# Patient Record
Sex: Male | Born: 1990 | Race: Black or African American | Hispanic: No | Marital: Single | Smoking: Current every day smoker
Health system: Southern US, Community
[De-identification: ages and names within clinical notes are randomized; demographics above are authoritative.]

---

## 2012-01-10 ENCOUNTER — Encounter (HOSPITAL_COMMUNITY): Payer: Self-pay | Admitting: Emergency Medicine

## 2012-01-10 ENCOUNTER — Emergency Department (HOSPITAL_COMMUNITY)
Admission: EM | Admit: 2012-01-10 | Discharge: 2012-01-11 | Disposition: A | Discharge status: Home / Self Care | Payer: Medicaid Other | Attending: Emergency Medicine | Admitting: Emergency Medicine

## 2012-01-10 DIAGNOSIS — F438 Other reactions to severe stress: Secondary | ICD-10-CM | POA: Insufficient documentation

## 2012-01-10 DIAGNOSIS — F17200 Nicotine dependence, unspecified, uncomplicated: Secondary | ICD-10-CM | POA: Insufficient documentation

## 2012-01-10 DIAGNOSIS — R443 Hallucinations, unspecified: Secondary | ICD-10-CM | POA: Insufficient documentation

## 2012-01-10 DIAGNOSIS — F430 Acute stress reaction: Secondary | ICD-10-CM

## 2012-01-10 DIAGNOSIS — F4389 Other reactions to severe stress: Secondary | ICD-10-CM | POA: Insufficient documentation

## 2012-01-10 NOTE — ED Provider Notes (Signed)
History     CSN: [id number]  Arrival date & time 01/10/12  2318   First MD Initiated Contact with Patient 01/10/12 2352      Chief Complaint  Patient presents with  . Suicidal    (Consider location/radiation/quality/duration/timing/severity/associated sxs/prior treatment) HPI Comments: Patient has been suicidal for 2 years per his own report he has attempted suicide in the past by trying to jump off a bridge.  Tonight, he again attempted to jump but  his brother stopped him.  He has not sought any outside help.  He refuses to talk about what is bothering him  The history is provided by the patient.    History reviewed. No pertinent past medical history.  History reviewed. No pertinent past surgical history.  No family history on file.  History  Substance Use Topics  . Smoking status: Current Everyday Smoker  . Smokeless tobacco: Not on file  . Alcohol Use: Yes      Review of Systems  Constitutional: Positive for activity change.  HENT: Negative for hearing loss.   Respiratory: Negative for shortness of breath.   Cardiovascular: Negative for chest pain.  Genitourinary: Negative for dysuria.  Neurological: Negative for dizziness and speech difficulty.  Psychiatric/Behavioral: Positive for suicidal ideas and hallucinations.    Allergies  Review of patient's allergies indicates no known allergies.  Home Medications  No current outpatient prescriptions on file.  BP 128/89  Pulse 93  Temp(Src) 98.1 F (36.7 C) (Oral)  Resp 16  SpO2 100%  Physical Exam  Constitutional: He is oriented to person, place, and time. He appears well-developed and well-nourished.  HENT:  Head: Normocephalic.  Eyes: Pupils are equal, round, and reactive to light.  Cardiovascular: Normal rate.   Pulmonary/Chest: Effort normal.  Musculoskeletal: Normal range of motion.  Neurological: He is alert and oriented to person, place, and time.  Skin: Skin is warm and dry.  Psychiatric: He is  actively hallucinating. He expresses no homicidal ideation.    ED Course  Procedures (including critical care time)  Labs Reviewed  URINE RAPID DRUG SCREEN (HOSP PERFORMED) - Abnormal; Notable for the following:    Tetrahydrocannabinol POSITIVE (*)    All other components within normal limits  ETHANOL  CBC  DIFFERENTIAL  BASIC METABOLIC PANEL   No results found.   1. Reaction, situational, acute, to stress       MDM  Will add ACT assess States patient with [facility]us like psychiatrist.  She feels that the patient was acting out, rather than truly being suicidal.  He does not display any depression or active suicidality.  She feels it is safe for him to be discharged home to his brother have discussed this with Dr. Earlie Lou.  I will refer him to our mental health services       Arman Filter, NP 01/11/12 0000  Arman Filter, NP 01/11/12 [phone]

## 2012-01-10 NOTE — ED Notes (Signed)
PT. REPORTS SUICIDAL IDEATION FOR SEVERAL DAYS WITH AUDITORY HALLUCINATIONS , PLANS TO JUMP OVER BRIDGE.  SECURITY WANDED PT. AT TRIAGE / PT. GIVEN PAPER SCRUBS TO WEAR.

## 2012-01-10 NOTE — ED Notes (Signed)
CHARGE NURSE NOTIFIED FOR PT.'S SITTER.

## 2012-01-11 LAB — RAPID URINE DRUG SCREEN, HOSP PERFORMED
Barbiturates: NOT DETECTED
Cocaine: NOT DETECTED
Tetrahydrocannabinol: POSITIVE — AB

## 2012-01-11 MED ORDER — IBUPROFEN 200 MG PO TABS: 600.0000 mg | ORAL_TABLET | Freq: Three times a day (TID) | ORAL | Status: DC | PRN

## 2012-01-11 MED ORDER — LORAZEPAM 1 MG PO TABS: 1.0000 mg | ORAL_TABLET | Freq: Three times a day (TID) | ORAL | Status: DC | PRN

## 2012-01-11 MED ORDER — NICOTINE 21 MG/24HR TD PT24: 21.0000 mg | MEDICATED_PATCH | Freq: Every day | TRANSDERMAL | Status: DC

## 2012-01-11 MED ORDER — ACETAMINOPHEN 325 MG PO TABS: 650.0000 mg | ORAL_TABLET | ORAL | Status: DC | PRN

## 2012-01-11 MED ORDER — ONDANSETRON HCL 8 MG PO TABS: 4.0000 mg | ORAL_TABLET | Freq: Three times a day (TID) | ORAL | Status: DC | PRN

## 2012-01-11 NOTE — ED Notes (Signed)
Pt resting. No needs at this time.

## 2012-01-11 NOTE — ED Notes (Signed)
Pt. In room condusting telepsych consult.

## 2012-01-11 NOTE — BH Assessment (Signed)
Assessment Note   Paul Hubbard is an 21 y.o. male brought to [facility] by his brother after being found attempting to jump off a bridge.  PT reports he's been having suicidal ideation for a few months now and has made a few other passive attempts.  Pt also thinks of killing himself with a gun.  Pt is flat and quiet.  Is not forthcoming about stressors, but reports being overwhelmed and feeling worthless.  Pt denies HI. Informatino faxed to [facility] for review for an inpatient bed.  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 No pertinent past medical history. Axis IV: other psychosocial or environmental problems Axis V: 21-30 behavior considerably influenced by delusions or hallucinations OR serious impairment in judgment, communication OR inability to function in almost all areas  Past Medical History: History reviewed. No pertinent past medical history.  History reviewed. No pertinent past surgical history.  Family History: No family history on file.  Social History:  reports that he has been smoking.  He does not have any smokeless tobacco history on file. He reports that he drinks alcohol. He reports that he does not use illicit drugs.  Additional Social History:  Alcohol / Drug Use History of alcohol / drug use?: No history of alcohol / drug abuse Substance #1 Name of Substance 1: Marijuna 1 - Age of First Use: 15 1 - Amount (size/oz): 1 joint 1 - Frequency: daily 1 - Duration: 5 years 1 - Last Use / Amount: 1 joint/01/10/12 Substance #2 Name of Substance 2: denies any other substance use 2 - Age of First Use: denies any other substance use 2 - Amount (size/oz): denies any other substance use 2 - Frequency: denies any other substance use 2 - Duration: denies any other substance use 2 - Last Use / Amount: denies any other substance use Allergies: No Known Allergies  Home Medications:  No current facility-administered medications  on file as of 01/10/2012.   No current outpatient prescriptions on file as of 01/10/2012.    OB/GYN Status:  No LMP for male patient.  General Assessment Data Location of Assessment: [facility] ED Living Arrangements: Relatives (Brother and mother) Can pt return to current living arrangement?: Yes Admission Status: Voluntary Is patient capable of signing voluntary admission?: Yes Transfer from: Acute Hospital Referral Source: Self/Family/Friend  Education Status Is patient currently in school?: Yes (Classes at [employer]) Highest grade of school patient has completed: 12  Risk to self Suicidal Ideation: Yes-Currently Present Suicidal Intent: No-Not Currently/Within Last 6 Months Is patient at risk for suicide?: Yes (jump off bridge, shoot self, multiple) Suicidal Plan?: Yes-Currently Present Specify Current Suicidal Plan: jump off bridge Access to Means: Yes Specify Access to Suicidal Means: environmental What has been your use of drugs/alcohol within the last 12 months?: marijuana Previous Attempts/Gestures: Yes How many times?: 2  Other Self Harm Risks: n/a Triggers for Past Attempts: Other personal contacts ("the past") Intentional Self Injurious Behavior: None Family Suicide History: Yes (uncle) Recent stressful life event(s): Other (Comment);Financial Problems;Job Loss (would not specify) Persecutory voices/beliefs?: No (pt denied to clinician, but admitted to NP) Depression: Yes Depression Symptoms: Despondent;Tearfulness;Fatigue;Guilt;Loss of interest in usual pleasures;Feeling worthless/self pity;Feeling angry/irritable;Isolating;Insomnia Substance abuse history and/or treatment for substance abuse?: Yes Suicide prevention information given to non-admitted patients: Not applicable  Risk to Others Homicidal Ideation: No Thoughts of Harm to Others: No Current Homicidal Intent: No Current Homicidal Plan: No Access to Homicidal Means: No History of harm  to others?: Yes Assessment  of Violence: In distant past Violent Behavior Description: calm and cooperative Does patient have access to weapons?: No Criminal Charges Pending?: No Does patient have a court date: No  Psychosis Hallucinations: None noted Delusions: None noted  Mental Status Report Appear/Hygiene: Other (Comment) (unremarkable) Eye Contact: Good Motor Activity: Unremarkable Speech: Logical/coherent;Soft Level of Consciousness: Alert Mood: Depressed;Sad;Worthless, low self-esteem Affect: Appropriate to circumstance;Sad;Sullen Anxiety Level: None Thought Processes: Coherent;Relevant Judgement: Unimpaired Orientation: Person;Place;Time;Situation Obsessive Compulsive Thoughts/Behaviors: None  Cognitive Functioning Concentration: Normal Memory: Recent Intact;Remote Intact IQ: Average Insight: Fair Impulse Control: Poor Appetite: Fair Weight Loss: 0  Weight Gain: 0  Sleep: Decreased Total Hours of Sleep: 5  Vegetative Symptoms: None  Prior Inpatient Therapy Prior Inpatient Therapy: No  Prior Outpatient Therapy Prior Outpatient Therapy: Yes Prior Therapy Dates: Middle School Prior Therapy Facilty/Provider(s): can't remember Reason for Treatment: depression  ADL Screening (condition at time of admission) Patient's cognitive ability adequate to safely complete daily activities?: Yes Patient able to express need for assistance with ADLs?: Yes Independently performs ADLs?: Yes Weakness of Legs: None Weakness of Arms/Hands: None       Abuse/Neglect Assessment (Assessment to be complete while patient is alone) Physical Abuse: Denies Verbal Abuse: Denies Sexual Abuse:  (PT denies abuse, previous note charted in error on wrong pt) Values / Beliefs Cultural Requests During Hospitalization: None Spiritual Requests During Hospitalization: None   Advance Directives (For Healthcare) Advance Directive: Patient does not have advance directive Nutrition Screen Unintentional weight loss  greater than 10lbs within the last month: No Dysphagia: No Home Tube Feeding or Total Parenteral Nutrition (TPN): No Patient appears severely malnourished: No Pregnant or Lactating: No  Additional Information 1:1 In Past 12 Months?: No CIRT Risk: No Elopement Risk: No Does patient have medical clearance?: Yes     Disposition:  Disposition Disposition of Patient: Inpatient treatment program Type of inpatient treatment program: Adult Patient referred to: [facility]  On Site Evaluation by:   Reviewed with Physician:     Steward Ros 01/11/2012 2:47 AM

## 2012-01-11 NOTE — ED Provider Notes (Signed)
Teleflex recommendations for discharge reviewed. I assessed the patient personally and he denied suicidal ideation or hallucinations. His family member who is at the bedside also was comfortable with plan for discharge home. Patient was discharged in good condition.  Cyndra Numbers, MD 01/11/12 [phone]

## 2012-01-11 NOTE — ED Notes (Signed)
Pt. Placed in Yellow 27, pt. On bed states hearing voices x several days, family at bedside.

## 2012-01-11 NOTE — ED Provider Notes (Signed)
Medical screening examination/treatment/procedure(s) were conducted as a shared visit with non-physician practitioner(s) and myself.  I personally evaluated the patient during the encounter  Cyndra Numbers, MD 01/11/12 [phone]

## 2014-08-29 ENCOUNTER — Encounter (HOSPITAL_COMMUNITY): Payer: Self-pay | Admitting: Emergency Medicine

## 2014-08-29 ENCOUNTER — Emergency Department (HOSPITAL_COMMUNITY)
Admission: EM | Admit: 2014-08-29 | Discharge: 2014-08-29 | Disposition: A | Discharge status: Home / Self Care | Payer: Medicaid Other | Attending: Emergency Medicine | Admitting: Emergency Medicine

## 2014-08-29 ENCOUNTER — Emergency Department (HOSPITAL_COMMUNITY): Payer: Medicaid Other

## 2014-08-29 DIAGNOSIS — F17200 Nicotine dependence, unspecified, uncomplicated: Secondary | ICD-10-CM | POA: Insufficient documentation

## 2014-08-29 DIAGNOSIS — N453 Epididymo-orchitis: Secondary | ICD-10-CM | POA: Insufficient documentation

## 2014-08-29 DIAGNOSIS — N451 Epididymitis: Secondary | ICD-10-CM

## 2014-08-29 DIAGNOSIS — R1909 Other intra-abdominal and pelvic swelling, mass and lump: Secondary | ICD-10-CM | POA: Insufficient documentation

## 2014-08-29 IMAGING — US US SCROTUM
1 series · 14 of 25 positions shown · non-contrast
Comparison: None.

CLINICAL DATA: Right testicular pain and swelling.

EXAM:
SCROTAL ULTRASOUND
DOPPLER ULTRASOUND OF THE TESTICLES
TECHNIQUE: Complete ultrasound examination of the testicles, epididymis, and
other scrotal structures was performed. Color and spectral Doppler
ultrasound were also utilized to evaluate blood flow to the
testicles.

[Series 1: us scrotum · 0.08mm/px · 14 of 33 slices shown]
[im 1/33]
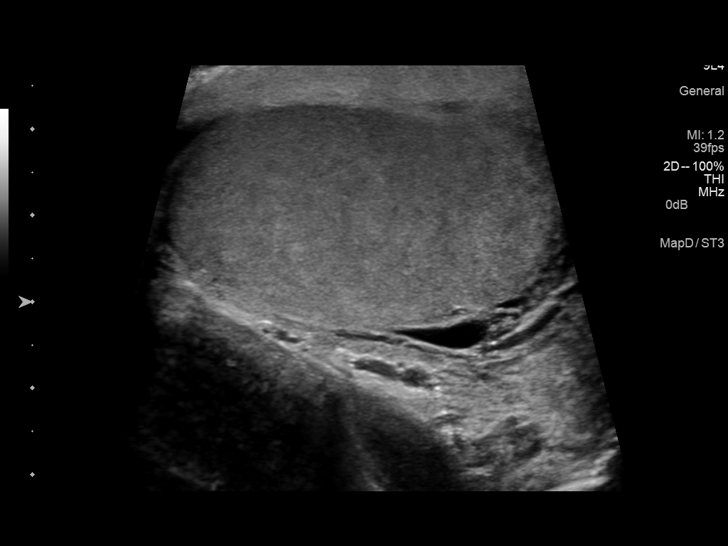
[im 3/33]
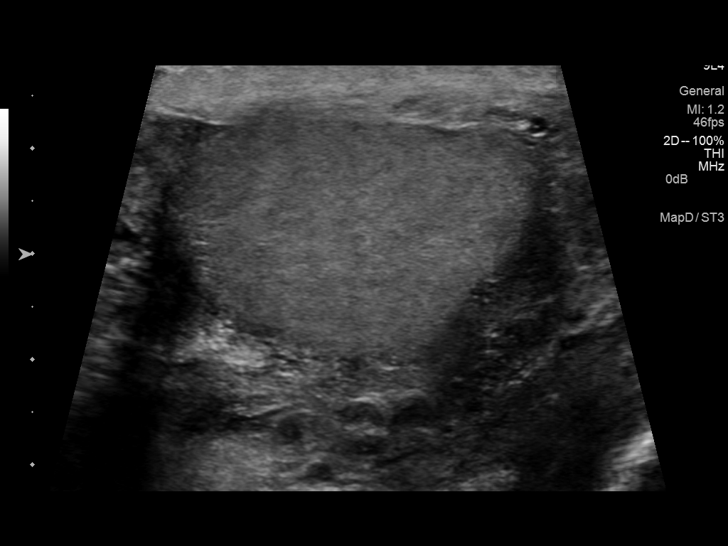
[im 6/33]
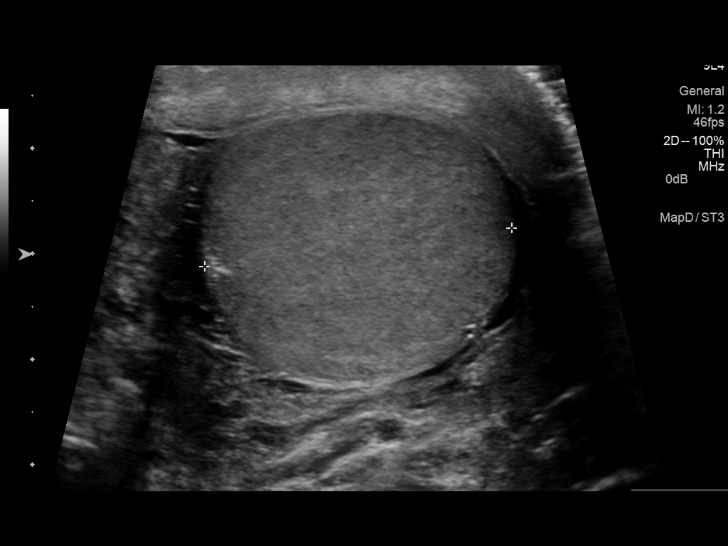
[im 9/33]
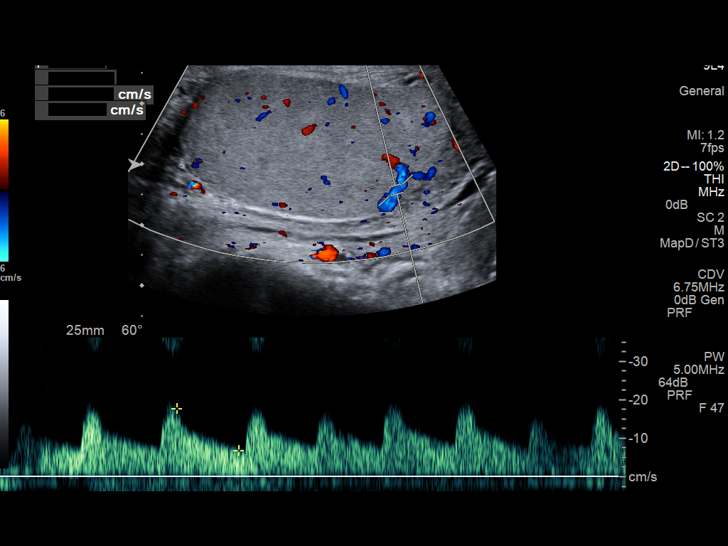
[im 11/33]
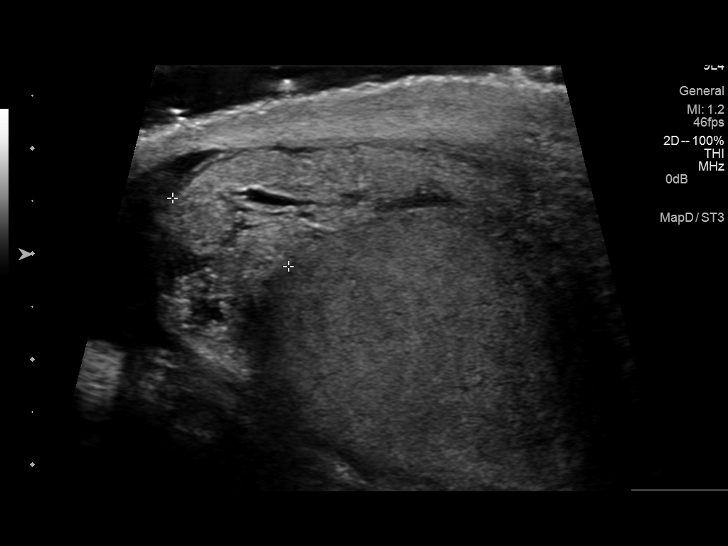
[im 13/33]
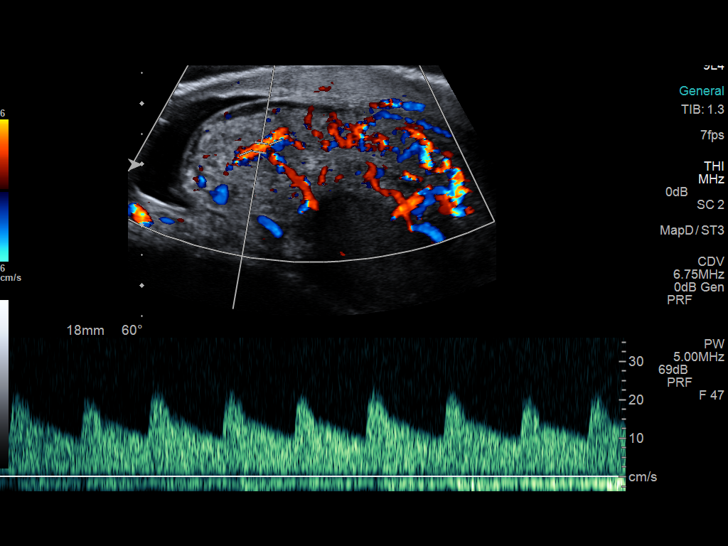
[im 15/33]
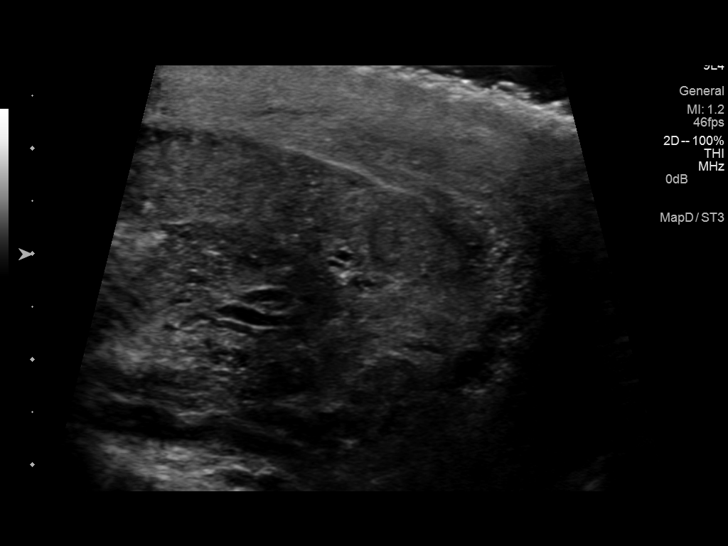
[im 18/33]
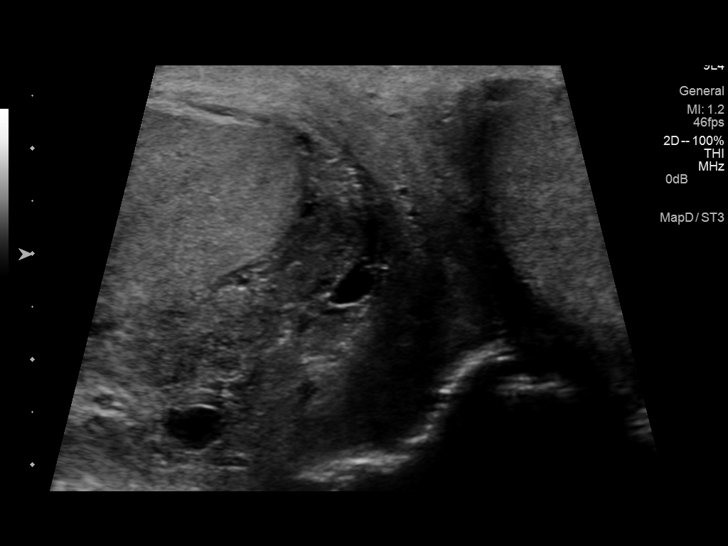
[im 21/33]
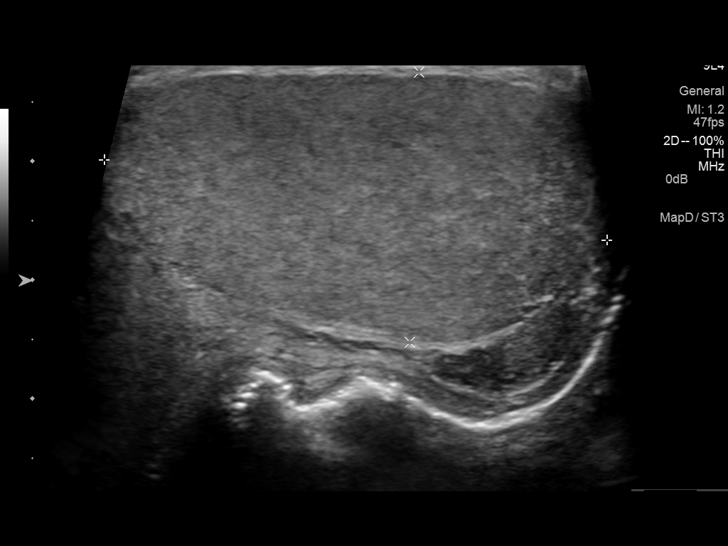
[im 22/33]
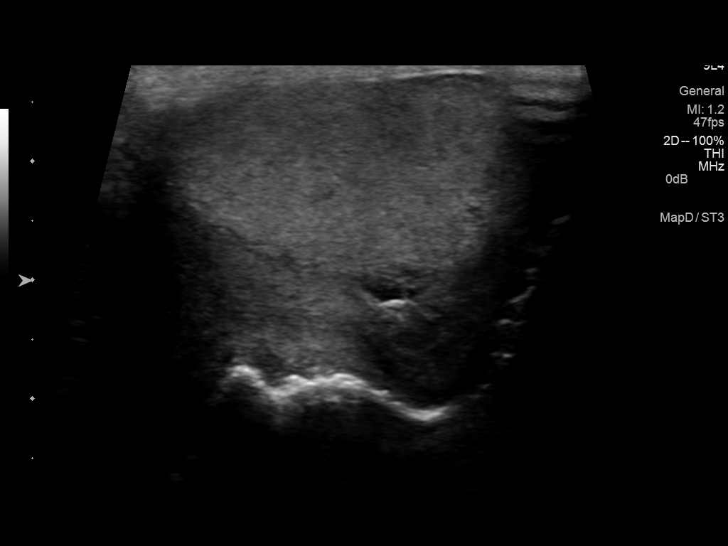
[im 25/33]
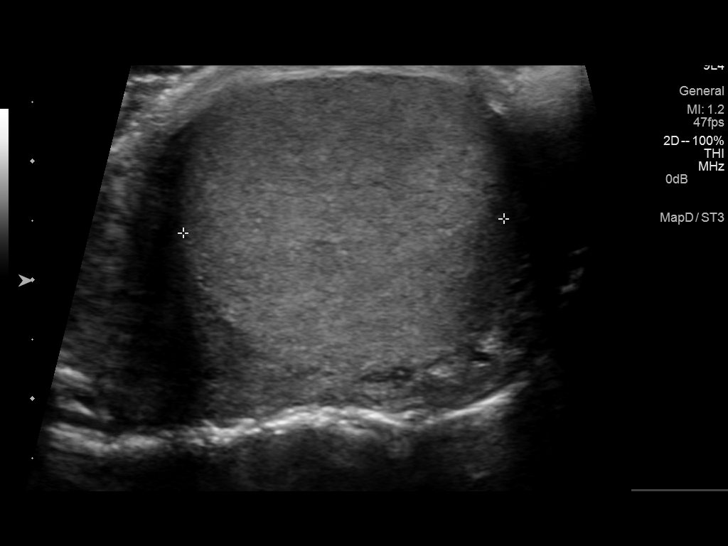
[im 27/33]
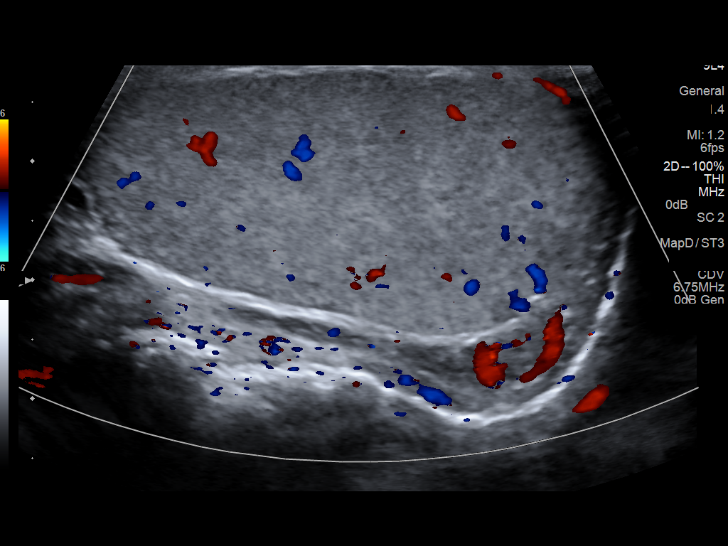
[im 30/33]
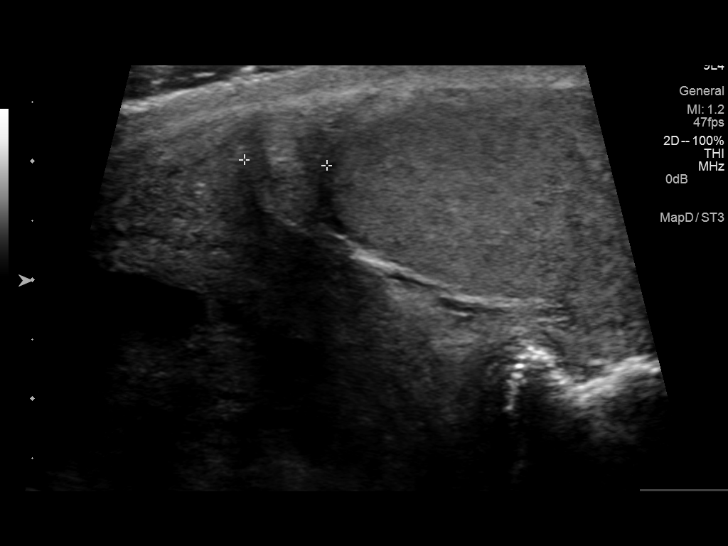
[im 33/33]
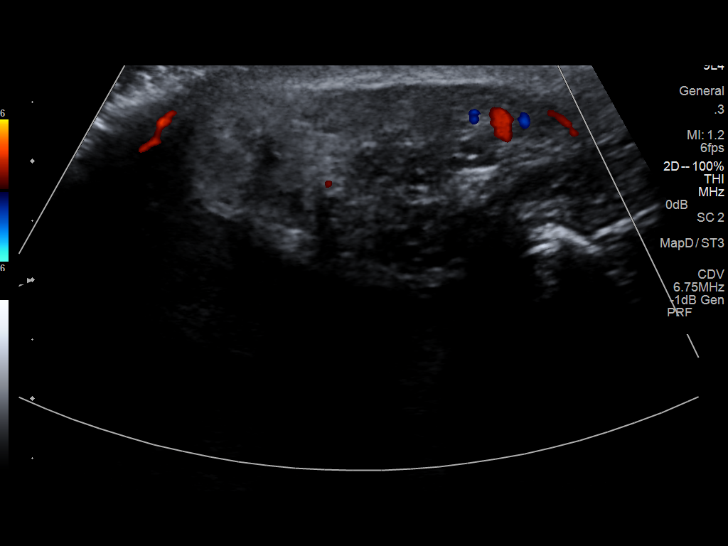

[14 of 25 positions shown; findings below may reference images not displayed]

FINDINGS: Right testicle

Measurements: 4.5 x 2.7 x 2.9 cm. No mass or microlithiasis
visualized. Increased flow is demonstrated with respect to the left
side consistent with orchitis.

Left testicle

Measurements: 4.3 x 2.3 x 2.4 cm. No mass or microlithiasis
visualized.

Right epididymis: Enlarged with increased flow on color flow Doppler
suggesting epididymitis.

Left epididymis:  Normal in size and appearance.

Hydrocele:  Small right hydrocele.

Pulsed Doppler interrogation of both testes demonstrates low
resistance arterial and venous waveforms bilaterally.
IMPRESSION: Increased flow demonstrated in the right epididymis and testis
consistent with epididymo-orchitis. Small right hydrocele.

## 2014-08-29 MED ORDER — LIDOCAINE HCL (PF) 1 % IJ SOLN
5.0000 mL | Freq: Once | INTRAMUSCULAR | Status: AC
  Administered 2014-08-29: 1.2 mL
  Filled 2014-08-29: qty 5

## 2014-08-29 MED ORDER — CEFTRIAXONE SODIUM 250 MG IJ SOLR
250.0000 mg | Freq: Once | INTRAMUSCULAR | Status: AC
  Administered 2014-08-29: 250 mg via INTRAMUSCULAR
  Filled 2014-08-29: qty 250

## 2014-08-29 MED ORDER — DOXYCYCLINE HYCLATE 100 MG PO TABS: 100.0000 mg | ORAL_TABLET | Freq: Two times a day (BID) | ORAL | Status: AC

## 2014-08-29 MED ORDER — DOXYCYCLINE HYCLATE 100 MG PO TABS
100.0000 mg | ORAL_TABLET | Freq: Once | ORAL | Status: AC
  Administered 2014-08-29: 100 mg via ORAL
  Filled 2014-08-29: qty 1

## 2014-08-29 NOTE — ED Notes (Signed)
Dr. Oni at bedside. 

## 2014-08-29 NOTE — ED Notes (Signed)
Swollen rt testicle and pain for 3 days.  Swelling worse today.  Nol voiding difficulty

## 2014-08-29 NOTE — ED Notes (Signed)
Pt c/o rt testicle pain x 3 days. Pain in groin started in the left testicle first then progressed to right testicle. Pain worsens when ambulating or when "boxers rub against testicle" Denies urinary issues, discharge, or hx of STD.

## 2014-08-29 NOTE — ED Provider Notes (Signed)
CSN: [id number]     Arrival date & time 08/29/14  0205 History   First MD Initiated Contact with Patient 08/29/14 0224     Chief Complaint  Patient presents with  . Groin Swelling     (Consider location/radiation/quality/duration/timing/severity/associated sxs/prior Treatment) HPI Paul Hubbard is a 23 y.o. male with past medical history coming in with scrotal swelling. Patient states his right scrotum is and swelling for the past 3 days. It is tender as well and feels more hard than the left. He has had recent unprotected sex with his girlfriend who accompanies him in the room.  He denies any penile discharge, penile swelling or tenderness. He has no dysuria or hematuria. He denies ever having this in the past. There's no abdominal pain nausea or vomiting. He's had no fevers chills or recent infections. Patient has no chest pain or shortness of breath, he has no further complaints.  10 Systems reviewed and are negative for acute change except as noted in the HPI.     History reviewed. No pertinent past medical history. History reviewed. No pertinent past surgical history. No family history on file. History  Substance Use Topics  . Smoking status: Current Every Day Smoker  . Smokeless tobacco: Not on file  . Alcohol Use: Yes    Review of Systems    Allergies  Review of patient's allergies indicates no known allergies.  Home Medications   Prior to Admission medications   Not on File   BP 127/74  Pulse 83  Temp(Src) 99 F (37.2 C) (Oral)  Resp 18  Ht  (1.803 m)  Wt 165 lb (74.844 kg)  BMI 23.02 kg/m2  SpO2 100% Physical Exam  Nursing note and vitals reviewed. Constitutional: He is oriented to person, place, and time. Vital signs are normal. He appears well-developed and well-nourished.  Non-toxic appearance. He does not appear ill. No distress.  HENT:  Head: Normocephalic and atraumatic.  Nose: Nose normal.  Mouth/Throat: Oropharynx is clear and moist. No  oropharyngeal exudate.  Eyes: Conjunctivae and EOM are normal. Pupils are equal, round, and reactive to light. No scleral icterus.  Neck: Normal range of motion. Neck supple. No tracheal deviation, no edema, no erythema and normal range of motion present. No mass and no thyromegaly present.  Cardiovascular: Normal rate, regular rhythm, S1 normal, S2 normal, normal heart sounds, intact distal pulses and normal pulses.  Exam reveals no gallop and no friction rub.   No murmur heard. Pulses:      Radial pulses are 2+ on the right side, and 2+ on the left side.       Dorsalis pedis pulses are 2+ on the right side, and 2+ on the left side.  Pulmonary/Chest: Effort normal and breath sounds normal. No respiratory distress. He has no wheezes. He has no rhonchi. He has no rales.  Abdominal: Soft. Normal appearance and bowel sounds are normal. He exhibits no distension, no ascites and no mass. There is no hepatosplenomegaly. There is no tenderness. There is no rebound, no guarding and no CVA tenderness.  Genitourinary: Penis normal. No penile tenderness.  Right scrotum is enlarged and indurated. It is tender to palpation. There is epididymal tenderness to palpation. There is no erythema noted. Left scrotum and epididymis are normal. There is no penile swelling or discharge. There is no tenderness to palpation of the penis. No genitalia lesions were seen.  Musculoskeletal: Normal range of motion. He exhibits no edema and no tenderness.  Lymphadenopathy:  He has no cervical adenopathy.  Neurological: He is alert and oriented to person, place, and time. He has normal strength. No cranial nerve deficit or sensory deficit. GCS eye subscore is 4. GCS verbal subscore is 5. GCS motor subscore is 6.  Skin: Skin is warm, dry and intact. No petechiae and no rash noted. He is not diaphoretic. No erythema. No pallor.  Psychiatric: He has a normal mood and affect. His behavior is normal. Judgment normal.    ED Course   Procedures (including critical care time) Labs Review Labs Reviewed - No data to display  Imaging Review US Scrotum  08/29/2014   CLINICAL DATA:  Right testicular pain and swelling.  EXAM: SCROTAL ULTRASOUND  DOPPLER ULTRASOUND OF THE TESTICLES  TECHNIQUE: Complete ultrasound examination of the testicles, epididymis, and other scrotal structures was performed. Color and spectral Doppler ultrasound were also utilized to evaluate blood flow to the testicles.  COMPARISON:  None.  FINDINGS: Right testicle  Measurements: 4.5 x 2.7 x 2.9 cm. No mass or microlithiasis visualized. Increased flow is demonstrated with respect to the left side consistent with orchitis.  Left testicle  Measurements: 4.3 x 2.3 x 2.4 cm. No mass or microlithiasis visualized.  Right epididymis: Enlarged with increased flow on color flow Doppler suggesting epididymitis.  Left epididymis:  Normal in size and appearance.  Hydrocele:  Small right hydrocele.  Pulsed Doppler interrogation of both testes demonstrates low resistance arterial and venous waveforms bilaterally.  IMPRESSION: Increased flow demonstrated in the right epididymis and testis consistent with epididymo-orchitis. Small right hydrocele.   Electronically Signed   By: Burman Nieves M.D.   On: 08/29/2014 04:39   [place] Art/ven Flow Abd Pelv Doppler  08/29/2014   CLINICAL DATA:  Right testicular pain and swelling.  EXAM: SCROTAL ULTRASOUND  DOPPLER ULTRASOUND OF THE TESTICLES  TECHNIQUE: Complete ultrasound examination of the testicles, epididymis, and other scrotal structures was performed. Color and spectral Doppler ultrasound were also utilized to evaluate blood flow to the testicles.  COMPARISON:  None.  FINDINGS: Right testicle  Measurements: 4.5 x 2.7 x 2.9 cm. No mass or microlithiasis visualized. Increased flow is demonstrated with respect to the left side consistent with orchitis.  Left testicle  Measurements: 4.3 x 2.3 x 2.4 cm. No mass or microlithiasis visualized.   Right epididymis: Enlarged with increased flow on color flow Doppler suggesting epididymitis.  Left epididymis:  Normal in size and appearance.  Hydrocele:  Small right hydrocele.  Pulsed Doppler interrogation of both testes demonstrates low resistance arterial and venous waveforms bilaterally.  IMPRESSION: Increased flow demonstrated in the right epididymis and testis consistent with epididymo-orchitis. Small right hydrocele.   Electronically Signed   By: Burman Nieves M.D.   On: 08/29/2014 04:39     EKG Interpretation None      MDM   Final diagnoses:  None    Patient does emergency primary concern for swelling of his right testicle for 3 days. Will evaluate with ultrasound.  Ultrasound reveals epididymo-orchitis.  He is a young sexually active male, thus was treating with ceftriaxone and doxy x 10days.  Told to get partners tested and follow up with PCP within 3 days.  Return precautions given, vitals remain within his normal limits and the patient is safe for discharge.   Tomasita Crumble, MD 08/29/14 1505

## 2014-08-29 NOTE — Discharge Instructions (Signed)
Epididymitis Paul Hubbard, you were seen today for swelling in your testicles.  Your ultrasound shows epididymitis and orchitis.  Take antibiotics for 10 days as prescribed and follow up with a primary physician for continued care.  This could have been due to an STD so your partner needs to get checked.  If your symptoms worsen, come back to the ED for repeat evaluation. Thank you. Epididymitis is a swelling (inflammation) of the epididymis. The epididymis is a cord-like structure along the back part of the testicle. Epididymitis is usually, but not always, caused by infection. This is usually a sudden problem beginning with chills, fever and pain behind the scrotum and in the testicle. There may be swelling and redness of the testicle. DIAGNOSIS  Physical examination will reveal a tender, swollen epididymis. Sometimes, cultures are obtained from the urine or from prostate secretions to help find out if there is an infection or if the cause is a different problem. Sometimes, blood work is performed to see if your white blood cell count is elevated and if a germ (bacterial) or viral infection is present. Using this knowledge, an appropriate medicine which kills germs (antibiotic) can be chosen by your caregiver. A viral infection causing epididymitis will most often go away (resolve) without treatment. HOME CARE INSTRUCTIONS   Hot sitz baths for 20 minutes, 4 times per day, may help relieve pain.  Only take over-the-counter or prescription medicines for pain, discomfort or fever as directed by your caregiver.  Take all medicines, including antibiotics, as directed. Take the antibiotics for the full prescribed length of time even if you are feeling better.  It is very important to keep all follow-up appointments. SEEK IMMEDIATE MEDICAL CARE IF:   You have a fever.  You have pain not relieved with medicines.  You have any worsening of your problems.  Your pain seems to come and go.  You develop  pain, redness, and swelling in the scrotum and surrounding areas. MAKE SURE YOU:   Understand these instructions.  Will watch your condition.  Will get help right away if you are not doing well or get worse. Document Released: 11/17/2000 Document Revised: 02/12/2012 Document Reviewed: 10/07/2009 [facility] Patient Information 2015 [place], [place]. This information is not intended to replace advice given to you by your health care provider. Make sure you discuss any questions you have with your health care provider.

## 2014-08-29 NOTE — ED Notes (Signed)
Discharge instructions given voiced understanding

## 2016-04-11 ENCOUNTER — Encounter (HOSPITAL_COMMUNITY): Payer: Self-pay

## 2016-04-11 DIAGNOSIS — F17200 Nicotine dependence, unspecified, uncomplicated: Secondary | ICD-10-CM | POA: Insufficient documentation

## 2016-04-11 DIAGNOSIS — Z792 Long term (current) use of antibiotics: Secondary | ICD-10-CM | POA: Insufficient documentation

## 2016-04-11 DIAGNOSIS — R590 Localized enlarged lymph nodes: Secondary | ICD-10-CM | POA: Insufficient documentation

## 2016-04-11 DIAGNOSIS — R300 Dysuria: Secondary | ICD-10-CM | POA: Insufficient documentation

## 2016-04-11 DIAGNOSIS — R369 Urethral discharge, unspecified: Secondary | ICD-10-CM | POA: Insufficient documentation

## 2016-04-11 NOTE — ED Notes (Signed)
Pt reports penile discharge and dysuria, onset three days ago

## 2016-04-12 ENCOUNTER — Emergency Department (HOSPITAL_COMMUNITY)
Admission: EM | Admit: 2016-04-12 | Discharge: 2016-04-12 | Disposition: A | Discharge status: Home / Self Care | Payer: Medicaid Other | Attending: Emergency Medicine | Admitting: Emergency Medicine

## 2016-04-12 DIAGNOSIS — R300 Dysuria: Secondary | ICD-10-CM

## 2016-04-12 DIAGNOSIS — R369 Urethral discharge, unspecified: Secondary | ICD-10-CM

## 2016-04-12 LAB — GC/CHLAMYDIA PROBE AMP (~~LOC~~) NOT AT ARMC
CHLAMYDIA, DNA PROBE: NEGATIVE
NEISSERIA GONORRHEA: POSITIVE — AB

## 2016-04-12 MED ORDER — CEFTRIAXONE SODIUM 250 MG IJ SOLR
250.0000 mg | Freq: Once | INTRAMUSCULAR | Status: AC
  Administered 2016-04-12: 250 mg via INTRAMUSCULAR
  Filled 2016-04-12: qty 250

## 2016-04-12 MED ORDER — LIDOCAINE HCL (PF) 1 % IJ SOLN
INTRAMUSCULAR | Status: AC
  Administered 2016-04-12: 1 mL
  Filled 2016-04-12: qty 5

## 2016-04-12 MED ORDER — AZITHROMYCIN 250 MG PO TABS
1000.0000 mg | ORAL_TABLET | Freq: Once | ORAL | Status: AC
  Administered 2016-04-12: 1000 mg via ORAL
  Filled 2016-04-12: qty 4

## 2016-04-12 NOTE — ED Provider Notes (Signed)
CSN: [id number]     Arrival date & time 04/11/16  2329 History   First MD Initiated Contact with Patient 04/12/16 0017     Chief Complaint  Patient presents with  . Penile Discharge     (Consider location/radiation/quality/duration/timing/severity/associated sxs/prior Treatment) Patient is a 25 y.o. male presenting with penile discharge. The history is provided by the patient. No language interpreter was used.  Penile Discharge This is a new problem. The current episode started in the past 7 days. The problem occurs constantly. The problem has been gradually worsening.   Paul Hubbard is a 25 y.o. male who presents to the ED with dysuria and penile discharge that started 3 days ago. He states that he has been with his current sex partner x 3 years. His partner started complaining of vaginal itching and burning and then he noted discharge and dysuria.   History reviewed. No pertinent past medical history. History reviewed. No pertinent past surgical history. No family history on file. Social History  Substance Use Topics  . Smoking status: Current Every Day Smoker  . Smokeless tobacco: None  . Alcohol Use: Yes    Review of Systems  Genitourinary: Positive for dysuria and discharge.  all other systems negative    Allergies  Review of patient's allergies indicates no known allergies.  Home Medications   Prior to Admission medications   Medication Sig Start Date End Date Taking? Authorizing Provider  doxycycline (VIBRA-TABS) 100 MG tablet Take 1 tablet (100 mg total) by mouth 2 (two) times daily. 08/29/14   Tomasita CrumbleAdeleke Oni, MD   BP 121/75 mmHg  Pulse 62  Temp(Src) 98 F (36.7 C) (Oral)  Resp 16  SpO2 100% Physical Exam  Constitutional: He is oriented to person, place, and time. He appears well-developed and well-nourished. No distress.  HENT:  Head: Normocephalic.  Eyes: Conjunctivae and EOM are normal.  Neck: Neck supple.  Cardiovascular: Normal rate.   Pulmonary/Chest:  Effort normal.  Abdominal: Soft. There is no tenderness.  Genitourinary: Testes normal. Circumcised. Discharge found.  Musculoskeletal: Normal range of motion.  Lymphadenopathy:       Left: Inguinal adenopathy present.  Neurological: He is alert and oriented to person, place, and time. No cranial nerve deficit.  Skin: Skin is warm and dry.  Psychiatric: He has a normal mood and affect. His behavior is normal.  Nursing note and vitals reviewed.   ED Course  Procedures (including critical care time) Labs Review Cultures for GC and Chlamydia sent   MDM  25 y.o. male with dysuria and discharge from his penis treated with Rocephin 250 mg IM and Zithromax 1 gram PO. He is to f/u with the health department for further testing.   Final diagnoses:  Penile discharge  Dysuria        Janne NapoleonHope M Marthann Abshier, NP 04/12/16 1956  Laurence Spatesachel Morgan Little, MD 04/13/16 [phone]

## 2016-04-12 NOTE — Discharge Instructions (Signed)
The cultures will not be back for 2 days. We will call you if they are positive. We have covered you with antibiotics for Gonorrhea and Chlamydia.

## 2016-04-12 NOTE — ED Notes (Signed)
Pt verbalized understanding of d/c instructions and has no further questions. Pt advised of no sexual intercourse for a week. Pt stable and NAD

## 2016-05-09 ENCOUNTER — Emergency Department (HOSPITAL_COMMUNITY)
Admission: EM | Admit: 2016-05-09 | Discharge: 2016-05-09 | Disposition: A | Discharge status: Home / Self Care | Payer: Medicaid Other | Attending: Emergency Medicine | Admitting: Emergency Medicine

## 2016-05-09 ENCOUNTER — Encounter (HOSPITAL_COMMUNITY): Payer: Self-pay | Admitting: *Deleted

## 2016-05-09 DIAGNOSIS — W57XXXA Bitten or stung by nonvenomous insect and other nonvenomous arthropods, initial encounter: Secondary | ICD-10-CM | POA: Insufficient documentation

## 2016-05-09 DIAGNOSIS — F17200 Nicotine dependence, unspecified, uncomplicated: Secondary | ICD-10-CM | POA: Insufficient documentation

## 2016-05-09 DIAGNOSIS — Y9389 Activity, other specified: Secondary | ICD-10-CM | POA: Insufficient documentation

## 2016-05-09 DIAGNOSIS — R000 Tachycardia, unspecified: Secondary | ICD-10-CM | POA: Insufficient documentation

## 2016-05-09 DIAGNOSIS — Y998 Other external cause status: Secondary | ICD-10-CM | POA: Insufficient documentation

## 2016-05-09 DIAGNOSIS — Y9289 Other specified places as the place of occurrence of the external cause: Secondary | ICD-10-CM | POA: Insufficient documentation

## 2016-05-09 DIAGNOSIS — S50862A Insect bite (nonvenomous) of left forearm, initial encounter: Secondary | ICD-10-CM | POA: Insufficient documentation

## 2016-05-09 DIAGNOSIS — L02414 Cutaneous abscess of left upper limb: Secondary | ICD-10-CM | POA: Insufficient documentation

## 2016-05-09 DIAGNOSIS — L0291 Cutaneous abscess, unspecified: Secondary | ICD-10-CM

## 2016-05-09 DIAGNOSIS — L03114 Cellulitis of left upper limb: Secondary | ICD-10-CM | POA: Insufficient documentation

## 2016-05-09 LAB — COMPREHENSIVE METABOLIC PANEL
ALT: 12 U/L — AB (ref 17–63)
ANION GAP: 9 (ref 5–15)
AST: 20 U/L (ref 15–41)
Albumin: 4 g/dL (ref 3.5–5.0)
Alkaline Phosphatase: 65 U/L (ref 38–126)
BUN: 8 mg/dL (ref 6–20)
CHLORIDE: 103 mmol/L (ref 101–111)
CO2: 25 mmol/L (ref 22–32)
CREATININE: 1.01 mg/dL (ref 0.61–1.24)
Calcium: 9.1 mg/dL (ref 8.9–10.3)
Glucose, Bld: 105 mg/dL — ABNORMAL HIGH (ref 65–99)
POTASSIUM: 3.6 mmol/L (ref 3.5–5.1)
SODIUM: 137 mmol/L (ref 135–145)
Total Bilirubin: 1.3 mg/dL — ABNORMAL HIGH (ref 0.3–1.2)
Total Protein: 7.3 g/dL (ref 6.5–8.1)

## 2016-05-09 LAB — CBC WITH DIFFERENTIAL/PLATELET
BASOS ABS: 0 10*3/uL (ref 0.0–0.1)
Basophils Absolute: 0 10*3/uL (ref 0.0–0.1)
Basophils Relative: 0 %
Basophils Relative: 0 %
EOS ABS: 0 10*3/uL (ref 0.0–0.7)
EOS PCT: 0 %
Eosinophils Absolute: 0 10*3/uL (ref 0.0–0.7)
Eosinophils Relative: 0 %
HCT: 42.9 % (ref 39.0–52.0)
HEMATOCRIT: 45.1 % (ref 39.0–52.0)
HEMOGLOBIN: 15.4 g/dL (ref 13.0–17.0)
Hemoglobin: 14.9 g/dL (ref 13.0–17.0)
LYMPHS ABS: 1.5 10*3/uL (ref 0.7–4.0)
LYMPHS ABS: 1.2 10*3/uL (ref 0.7–4.0)
LYMPHS PCT: 10 %
Lymphocytes Relative: 13 %
MCH: 28.2 pg (ref 26.0–34.0)
MCH: 28 pg (ref 26.0–34.0)
MCHC: 34.7 g/dL (ref 30.0–36.0)
MCHC: 34.1 g/dL (ref 30.0–36.0)
MCV: 81.1 fL (ref 78.0–100.0)
MCV: 82 fL (ref 78.0–100.0)
MONO ABS: 1.2 10*3/uL — AB (ref 0.1–1.0)
MONOS PCT: 9 %
Monocytes Absolute: 1 10*3/uL (ref 0.1–1.0)
Monocytes Relative: 10 %
NEUTROS ABS: 9.7 10*3/uL — AB (ref 1.7–7.7)
NEUTROS PCT: 81 %
Neutro Abs: 9 10*3/uL — ABNORMAL HIGH (ref 1.7–7.7)
Neutrophils Relative %: 77 %
PLATELETS: 175 10*3/uL (ref 150–400)
Platelets: 178 10*3/uL (ref 150–400)
RBC: 5.29 MIL/uL (ref 4.22–5.81)
RBC: 5.5 MIL/uL (ref 4.22–5.81)
RDW: 12.3 % (ref 11.5–15.5)
RDW: 12.2 % (ref 11.5–15.5)
WBC: 11.7 10*3/uL — AB (ref 4.0–10.5)
WBC: 12 10*3/uL — AB (ref 4.0–10.5)

## 2016-05-09 LAB — BASIC METABOLIC PANEL
Anion gap: 8 (ref 5–15)
BUN: 12 mg/dL (ref 6–20)
CALCIUM: 9.1 mg/dL (ref 8.9–10.3)
CO2: 27 mmol/L (ref 22–32)
Chloride: 102 mmol/L (ref 101–111)
Creatinine, Ser: 0.99 mg/dL (ref 0.61–1.24)
GFR calc Af Amer: >60 mL/min (ref >60)
Glucose, Bld: 145 mg/dL — ABNORMAL HIGH (ref 65–99)
POTASSIUM: 3.4 mmol/L — AB (ref 3.5–5.1)
SODIUM: 137 mmol/L (ref 135–145)

## 2016-05-09 LAB — I-STAT CG4 LACTIC ACID, ED: Lactic Acid, Venous: 1.18 mmol/L (ref 0.5–2.0)

## 2016-05-09 MED ORDER — ACETAMINOPHEN 325 MG PO TABS
650.0000 mg | ORAL_TABLET | Freq: Once | ORAL | Status: AC | PRN
  Administered 2016-05-09: 650 mg via ORAL

## 2016-05-09 MED ORDER — ONDANSETRON 4 MG PO TBDP
4.0000 mg | ORAL_TABLET | Freq: Once | ORAL | Status: AC
  Administered 2016-05-09: 4 mg via ORAL
  Filled 2016-05-09: qty 1

## 2016-05-09 MED ORDER — ACETAMINOPHEN 325 MG PO TABS
ORAL_TABLET | ORAL | Status: AC
  Filled 2016-05-09: qty 2

## 2016-05-09 MED ORDER — LIDOCAINE-EPINEPHRINE 0.5 %-1:200000 IJ SOLN
30.0000 mL | Freq: Once | INTRAMUSCULAR | Status: AC
  Administered 2016-05-09: 30 mL
  Filled 2016-05-09: qty 30

## 2016-05-09 MED ORDER — CLINDAMYCIN HCL 150 MG PO CAPS
300.0000 mg | ORAL_CAPSULE | Freq: Once | ORAL | Status: AC
  Administered 2016-05-09: 300 mg via ORAL
  Filled 2016-05-09: qty 2

## 2016-05-09 MED ORDER — ONDANSETRON HCL 4 MG/2ML IJ SOLN: 4.0000 mg | Freq: Once | INTRAMUSCULAR | Status: DC

## 2016-05-09 MED ORDER — IBUPROFEN 600 MG PO TABS: 600.0000 mg | ORAL_TABLET | Freq: Four times a day (QID) | ORAL | Status: AC | PRN

## 2016-05-09 MED ORDER — OXYCODONE-ACETAMINOPHEN 5-325 MG PO TABS
1.0000 | ORAL_TABLET | Freq: Once | ORAL | Status: AC
  Administered 2016-05-09: 1 via ORAL
  Filled 2016-05-09: qty 1

## 2016-05-09 MED ORDER — IBUPROFEN 400 MG PO TABS
ORAL_TABLET | ORAL | Status: AC
  Filled 2016-05-09: qty 2

## 2016-05-09 MED ORDER — CLINDAMYCIN HCL 150 MG PO CAPS: 450.0000 mg | ORAL_CAPSULE | Freq: Three times a day (TID) | ORAL | Status: AC

## 2016-05-09 MED ORDER — IBUPROFEN 400 MG PO TABS
800.0000 mg | ORAL_TABLET | Freq: Once | ORAL | Status: AC
  Administered 2016-05-09: 800 mg via ORAL

## 2016-05-09 NOTE — ED Notes (Signed)
MD made aware everything is at bedside, ready.

## 2016-05-09 NOTE — ED Notes (Signed)
Pt put labels on the counter and walked out

## 2016-05-09 NOTE — ED Notes (Signed)
Patient presents with left forearm swollen - ?insect bite  Warm to touch and c/o it being painful

## 2016-05-09 NOTE — ED Provider Notes (Signed)
CSN: [id number]     Arrival date & time 05/09/16  1314 History   First MD Initiated Contact with Patient 05/09/16 1555     Chief Complaint  Patient presents with  . Abscess   Patient is a 25 y.o. male presenting with abscess.  Abscess Location:  Shoulder/arm Shoulder/arm abscess location:  L forearm Size:  3cm Abscess quality: painful, redness and warmth   Duration:  2 days Progression:  Worsening Pain details:    Quality:  Aching   Severity:  Moderate   Timing:  Constant   Progression:  Worsening Chronicity:  New Context: insect bite/sting   Context: not diabetes, not immunosuppression and not skin injury   Relieved by:  None tried Worsened by:  Nothing tried Ineffective treatments:  None tried Associated symptoms: fatigue and fever   Associated symptoms: no anorexia, no headaches, no nausea and no vomiting   Risk factors: no family hx of MRSA, no hx of MRSA and no prior abscess      History reviewed. No pertinent past medical history. History reviewed. No pertinent past surgical history. No family history on file. Social History  Substance Use Topics  . Smoking status: Current Every Day Smoker  . Smokeless tobacco: Never Used  . Alcohol Use: No    Review of Systems  Constitutional: Positive for fever and fatigue.  Gastrointestinal: Negative for nausea, vomiting and anorexia.  Skin: Positive for rash and wound.  Neurological: Negative for headaches.  All other systems reviewed and are negative.     Allergies  Review of patient's allergies indicates no known allergies.  Home Medications   Prior to Admission medications   Medication Sig Start Date End Date Taking? Authorizing Provider  clindamycin (CLEOCIN) 150 MG capsule Take 3 capsules (450 mg total) by mouth 3 (three) times daily. 05/09/16   Sidney Ace, MD  doxycycline (VIBRA-TABS) 100 MG tablet Take 1 tablet (100 mg total) by mouth 2 (two) times daily. Patient not taking: Reported on 05/09/2016  08/29/14   Tomasita Crumble, MD  ibuprofen (ADVIL,MOTRIN) 600 MG tablet Take 1 tablet (600 mg total) by mouth every 6 (six) hours as needed. 05/09/16   Sidney Ace, MD   BP 124/82 mmHg  Pulse 100  Temp(Src) 102.1 F (38.9 C) (Oral)  Resp 18  SpO2 100% Physical Exam  Constitutional: He appears well-developed and well-nourished. No distress.  HENT:  Head: Normocephalic and atraumatic.  Left Ear: External ear normal.  Eyes: Conjunctivae are normal. Pupils are equal, round, and reactive to light. Right eye exhibits no discharge. Left eye exhibits no discharge.  Neck: Normal range of motion. Neck supple.  Cardiovascular: Normal rate, regular rhythm and intact distal pulses.   No murmur heard. Pulmonary/Chest: Effort normal and breath sounds normal. No respiratory distress.  Abdominal: Soft. Bowel sounds are normal. He exhibits no distension and no mass. There is no tenderness. There is no rebound and no guarding.  Musculoskeletal: He exhibits no edema.  Neurological: He is alert.  Skin: Skin is warm. Rash noted. He is not diaphoretic. There is erythema (warmth along left forearm from elbow to metacarpals with 3cm fluctuant lesion with puncture site in middle of lateral aspect of left forearm. Full ROM of wrist and elbow joints without evidence of effusion on exam. ).  Psychiatric: He has a normal mood and affect.    ED Course  .Marland KitchenIncision and Drainage Date/Time: 05/10/2016 2:19 AM Performed by: Sidney Ace Authorized by: Sidney Ace Consent: Verbal consent obtained.  Risks and benefits: risks, benefits and alternatives were discussed Consent given by: patient Patient understanding: patient states understanding of the procedure being performed Patient identity confirmed: verbally with patient Time out: Immediately prior to procedure a "time out" was called to verify the correct patient, procedure, equipment, support staff and site/side marked as required. Type:  abscess Body area: upper extremity Location details: left arm Anesthesia: local infiltration Local anesthetic: lidocaine 1% with epinephrine Anesthetic total: 10 ml Patient sedated: no Scalpel size: 11 Needle gauge: 20 Incision type: single straight Incision depth: subcutaneous Complexity: simple Drainage: purulent Drainage amount: moderate Wound treatment: wound left open Packing material: 1/4 in gauze Patient tolerance: Patient tolerated the procedure well with no immediate complications   (including critical care time) Labs Review Labs Reviewed  COMPREHENSIVE METABOLIC PANEL - Abnormal; Notable for the following:    Glucose, Bld 105 (*)    ALT 12 (*)    Total Bilirubin 1.3 (*)    All other components within normal limits  CBC WITH DIFFERENTIAL/PLATELET - Abnormal; Notable for the following:    WBC 12.0 (*)    Neutro Abs 9.7 (*)    All other components within normal limits  I-STAT CG4 LACTIC ACID, ED  I-STAT CG4 LACTIC ACID, ED    Imaging Review No results found. I have personally reviewed and evaluated these images and lab results as part of my medical decision-making.   EKG Interpretation None      MDM   Final diagnoses:  Abscess  Cellulitis of left upper extremity    Abscess with surrounding cellulitis with no significant risk factors for MRSA. Patient tachycardic and febrile. Tylenol provided and I&D performed. Patient able to tolerate by mouth after some Zofran provided. He was nauseous after observing the procedure. He has not been on any recent antibiotics and is otherwise healthy and well-appearing. Will treat with clindamycin and have patient return to fast track or establish care with a primary care provider in 2 days for reassessment. Return for worsening symptoms including severe pain, expansion of the cellulitis, or other concerning symptoms. Patient verbalized understanding and agreement with plan. Patient discharged in good condition.    Sidney AceAlison  Charruf Ruthia Person, MD 05/10/16 [id number]  Dione Boozeavid Glick, MD 05/10/16 [phone]

## 2016-05-09 NOTE — ED Notes (Signed)
Cellulitis area marked to left forearm. Pt is drinking water, has taken 2nd dose of antibiotics due to vomiting 1st dose.

## 2016-05-09 NOTE — Discharge Instructions (Signed)

## 2016-05-09 NOTE — ED Notes (Signed)
Pt is here with abscess to left forearm that has increased with swelling.  Left radial pulse present and cap refill wnl.  Pt states arm is throbbing and he cannot feel it.

## 2016-05-10 LAB — I-STAT CG4 LACTIC ACID, ED: LACTIC ACID, VENOUS: 0.93 mmol/L (ref 0.5–2.0)

## 2016-06-20 ENCOUNTER — Telehealth: Payer: Self-pay | Admitting: *Deleted

## 2016-06-20 NOTE — Telephone Encounter (Signed)
(+)  GC, no response to phone or letter, unable to notify of (+) results 

## 2019-01-24 ENCOUNTER — Other Ambulatory Visit: Payer: Self-pay

## 2019-01-24 ENCOUNTER — Encounter (HOSPITAL_COMMUNITY): Payer: Self-pay | Admitting: Emergency Medicine

## 2019-01-24 ENCOUNTER — Emergency Department (HOSPITAL_COMMUNITY)
Admission: EM | Admit: 2019-01-24 | Discharge: 2019-01-24 | Disposition: A | Discharge status: Home / Self Care | Payer: Self-pay | Attending: Emergency Medicine | Admitting: Emergency Medicine

## 2019-01-24 DIAGNOSIS — Z202 Contact with and (suspected) exposure to infections with a predominantly sexual mode of transmission: Secondary | ICD-10-CM | POA: Insufficient documentation

## 2019-01-24 DIAGNOSIS — F17200 Nicotine dependence, unspecified, uncomplicated: Secondary | ICD-10-CM | POA: Insufficient documentation

## 2019-01-24 DIAGNOSIS — R369 Urethral discharge, unspecified: Secondary | ICD-10-CM | POA: Insufficient documentation

## 2019-01-24 LAB — URINALYSIS, ROUTINE W REFLEX MICROSCOPIC
BILIRUBIN URINE: NEGATIVE
GLUCOSE, UA: NEGATIVE mg/dL
Hgb urine dipstick: NEGATIVE
KETONES UR: NEGATIVE mg/dL
NITRITE: NEGATIVE
PH: 7 (ref 5.0–8.0)
Protein, ur: NEGATIVE mg/dL
Specific Gravity, Urine: 1.02 (ref 1.005–1.030)
WBC, UA: >50 WBC/hpf — ABNORMAL HIGH (ref 0–5)

## 2019-01-24 MED ORDER — CEFTRIAXONE SODIUM 250 MG IJ SOLR
250.0000 mg | Freq: Once | INTRAMUSCULAR | Status: AC
  Administered 2019-01-24: 250 mg via INTRAMUSCULAR
  Filled 2019-01-24: qty 250

## 2019-01-24 MED ORDER — AZITHROMYCIN 250 MG PO TABS
1000.0000 mg | ORAL_TABLET | Freq: Once | ORAL | Status: AC
  Administered 2019-01-24: 1000 mg via ORAL
  Filled 2019-01-24: qty 4

## 2019-01-24 MED ORDER — LIDOCAINE HCL (PF) 1 % IJ SOLN
1.0000 mL | Freq: Once | INTRAMUSCULAR | Status: AC
  Administered 2019-01-24: 1 mL
  Filled 2019-01-24: qty 30

## 2019-01-24 NOTE — ED Triage Notes (Addendum)
Patient c/o green discharge from penis x3 days. Hx gonorrhea. Denies urinary sx.

## 2019-01-24 NOTE — ED Notes (Signed)
UA sample is sitting in yellow bin @ triage

## 2019-01-24 NOTE — ED Provider Notes (Addendum)
[facility]-EMERGENCY DEPT Provider Note   CSN: [id number] Arrival date & time: 01/24/19  1427    History   Chief Complaint Chief Complaint  Patient presents with  . Penile Discharge    HPI Paul Hubbard is a 28 y.o. male with past medical history of gonorrhea, presenting to the emergency department with complaint of 3 days of green discharge from his penis.  Patient states this is similar to his prior episode of gonorrhea.  He is currently sexually active with male partners without protection.  Denies dysuria, penile pain, testicular pain or swelling, abdominal pain, pain with defecation, fever.  Treated symptoms with AZO without relief. No recent antibiotics.     The history is provided by the patient.    History reviewed. No pertinent past medical history.  There are no active problems to display for this patient.   History reviewed. No pertinent surgical history.      Home Medications    Prior to Admission medications   Medication Sig Start Date End Date Taking? Authorizing Provider  clindamycin (CLEOCIN) 150 MG capsule Take 3 capsules (450 mg total) by mouth 3 (three) times daily. 05/09/16   Sidney Ace, MD  doxycycline (VIBRA-TABS) 100 MG tablet Take 1 tablet (100 mg total) by mouth 2 (two) times daily. Patient not taking: Reported on 05/09/2016 08/29/14   Tomasita Crumble, MD  ibuprofen (ADVIL,MOTRIN) 600 MG tablet Take 1 tablet (600 mg total) by mouth every 6 (six) hours as needed. 05/09/16   Sidney Ace, MD    Family History No family history on file.  Social History Social History   Tobacco Use  . Smoking status: Current Every Day Smoker  . Smokeless tobacco: Never Used  Substance Use Topics  . Alcohol use: No  . Drug use: No     Allergies   Patient has no known allergies.   Review of Systems Review of Systems  Constitutional: Negative for fever.  Gastrointestinal: Negative for abdominal pain.  Genitourinary:  Positive for discharge. Negative for dysuria, frequency, penile pain, scrotal swelling and testicular pain.     Physical Exam Updated Vital Signs BP 114/81 (BP Location: Left Arm)   Pulse 82   Temp 99.4 F (37.4 C) (Oral)   Resp 18   Ht 5\' 11"  (1.803 m)   Wt 63.5 kg   SpO2 98%   BMI 19.53 kg/m   Physical Exam Vitals signs and nursing note reviewed.  Constitutional:      General: He is not in acute distress.    Appearance: He is well-developed. He is not ill-appearing.  HENT:     Head: Normocephalic and atraumatic.  Eyes:     Conjunctiva/sclera: Conjunctivae normal.  Cardiovascular:     Rate and Rhythm: Normal rate.  Pulmonary:     Effort: Pulmonary effort is normal.  Abdominal:     General: Bowel sounds are normal. There is no distension.     Palpations: Abdomen is soft.     Tenderness: There is no abdominal tenderness.  Genitourinary:    Penis: Circumcised. Discharge present.      Comments: Exam performed with male RN chaperone present.  Exam is limited because patient would not allow palpation on exam.  Per visual exam, there is yellow purulent discharge from the urethral meatus.  No obvious redness or swelling. Skin:    General: Skin is warm.  Neurological:     Mental Status: He is alert.  Psychiatric:  Mood and Affect: Mood normal.        Behavior: Behavior normal.      ED Treatments / Results  Labs (all labs ordered are listed, but only abnormal results are displayed) Labs Reviewed  URINALYSIS, ROUTINE W REFLEX MICROSCOPIC - Abnormal; Notable for the following components:      Result Value   Color, Urine AMBER (*)    Leukocytes,Ua TRACE (*)    WBC, UA >50 (*)    Bacteria, UA RARE (*)    All other components within normal limits  URINE CULTURE  HIV ANTIBODY (ROUTINE TESTING W REFLEX)  RPR  GC/CHLAMYDIA PROBE AMP ([place]) NOT AT [facility]    EKG None  Radiology No results found.  Procedures Procedures (including critical care  time)  Medications Ordered in ED Medications  cefTRIAXone (ROCEPHIN) injection 250 mg (250 mg Intramuscular Given 01/24/19 1641)  azithromycin (ZITHROMAX) tablet 1,000 mg (1,000 mg Oral Given 01/24/19 1640)  lidocaine (PF) (XYLOCAINE) 1 % injection 1 mL (1 mL Other Given 01/24/19 1641)     Initial Impression / Assessment and Plan / ED Course  I have reviewed the triage vital signs and the nursing notes.  Pertinent labs & imaging results that were available during my care of the patient were reviewed by me and considered in my medical decision making (see chart for details).        Patient with penile discharge x3 days.  Risky sexual behavior.  Patient is afebrile without abdominal tenderness, abdominal pain or painful bowel movements to indicate prostatitis.  Patient would not allow palpation of genitalia on exam, however with visual inspection no obvious redness or swelling.  Patient denied any tenderness to palpation of the testes or epididymis to suggest orchitis or epididymitis.  STD cultures obtained including HIV, syphilis, gonorrhea and chlamydia.  U/A microscopic without trichomonas. Urine cultures sent. Shared decision making with pt regarding urine results. No urinary sx to suggest UTI at this time. Will hold on abx for UTI pending urine culture. Suspect U/A findings are secondary to gonococcal urethritis. Patient has been treated prophylactically with azithromycin and Rocephin. Patient to be discharged with instructions to follow up with PCP. Discussed importance of using protection when sexually active. Pt understands that they have GC/Chlamydia cultures pending and that they will need to inform all sexual partners if results return positive.   Discussed results, findings, treatment and follow up. Patient advised of return precautions. Patient verbalized understanding and agreed with plan.  Final Clinical Impressions(s) / ED Diagnoses   Final diagnoses:  Penile discharge  Possible  exposure to STD    ED Discharge Orders    None       Vandella Ord, [place], PA-C 01/24/19 1722    Shaniqwa Horsman, [place], [place] 01/24/19 1723    Tegeler, Canary Brim, MD 01/24/19 [phone]

## 2019-01-24 NOTE — Discharge Instructions (Addendum)
Please read the instructions below.  Talk with your primary care provider about any new medications.  You have been treated today for gonorrhea and chlamydia. You will receive a call from the hospital if your test results come back positive. Avoid sexual activity until you know your test results. If your results come back positive, it is important that you inform all of your sexual partners.  Return to the ER for new or worsening symptoms.   

## 2019-01-25 LAB — URINE CULTURE: CULTURE: NO GROWTH

## 2019-01-25 LAB — RPR: RPR Ser Ql: NONREACTIVE

## 2019-01-25 LAB — HIV ANTIBODY (ROUTINE TESTING W REFLEX): HIV Screen 4th Generation wRfx: NONREACTIVE

## 2023-10-01 ENCOUNTER — Other Ambulatory Visit: Payer: Self-pay

## 2023-10-01 ENCOUNTER — Emergency Department (HOSPITAL_COMMUNITY): Payer: Self-pay

## 2023-10-01 ENCOUNTER — Encounter (HOSPITAL_COMMUNITY): Payer: Self-pay

## 2023-10-01 ENCOUNTER — Emergency Department (HOSPITAL_COMMUNITY)
Admission: EM | Admit: 2023-10-01 | Discharge: 2023-10-01 | Disposition: A | Discharge status: Home / Self Care | Payer: Self-pay | Attending: Emergency Medicine | Admitting: Emergency Medicine

## 2023-10-01 DIAGNOSIS — R0781 Pleurodynia: Secondary | ICD-10-CM | POA: Insufficient documentation

## 2023-10-01 DIAGNOSIS — M542 Cervicalgia: Secondary | ICD-10-CM | POA: Insufficient documentation

## 2023-10-01 DIAGNOSIS — Z0000 Encounter for general adult medical examination without abnormal findings: Secondary | ICD-10-CM

## 2023-10-01 MED ORDER — ACETAMINOPHEN 325 MG PO TABS: 650.0000 mg | ORAL_TABLET | Freq: Four times a day (QID) | ORAL | 0 refills | Status: AC | PRN

## 2023-10-01 MED ORDER — ACETAMINOPHEN 500 MG PO TABS
1000.0000 mg | ORAL_TABLET | Freq: Once | ORAL | Status: AC
  Administered 2023-10-01: 1000 mg via ORAL
  Filled 2023-10-01: qty 2

## 2023-10-01 NOTE — Discharge Instructions (Addendum)
You were evaluated at the emergency department.  Certain imaging was recommended but you declined.  Your vitals were normal and you have been discharged back into the custody of [facility].
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
## 2023-10-01 NOTE — ED Notes (Signed)
Remains uncooperative, verbally abusive to staff. Refuses vital signs. Refuses radiology. In police custody.

## 2023-10-01 NOTE — ED Notes (Signed)
Pt being non cooperative at this time.
